# Patient Record
Sex: Female | Born: 1960 | Race: White | Hispanic: No | Smoking: Never smoker
Health system: Southern US, Community
[De-identification: ages and names within clinical notes are randomized; demographics above are authoritative.]

## PROBLEM LIST (undated history)

## (undated) DIAGNOSIS — K219 Gastro-esophageal reflux disease without esophagitis: Secondary | ICD-10-CM

## (undated) DIAGNOSIS — E785 Hyperlipidemia, unspecified: Secondary | ICD-10-CM

## (undated) DIAGNOSIS — N200 Calculus of kidney: Secondary | ICD-10-CM

## (undated) DIAGNOSIS — I10 Essential (primary) hypertension: Secondary | ICD-10-CM

## (undated) HISTORY — PX: KIDNEY STONE SURGERY: SHX686

---

## 2013-01-09 ENCOUNTER — Ambulatory Visit: Payer: Self-pay | Admitting: Internal Medicine

## 2013-01-09 LAB — URINALYSIS, COMPLETE
Bilirubin,UR: NEGATIVE
Glucose,UR: NEGATIVE mg/dL (ref 0–75)
Nitrite: NEGATIVE
Protein: NEGATIVE
Specific Gravity: 1.005 (ref 1.003–1.030)

## 2014-03-28 ENCOUNTER — Ambulatory Visit: Payer: Self-pay | Admitting: Physician Assistant

## 2014-03-28 LAB — URINALYSIS, COMPLETE
Bacteria: NEGATIVE
Bilirubin,UR: NEGATIVE
Blood: NEGATIVE
GLUCOSE, UR: NEGATIVE mg/dL (ref 0–75)
KETONE: NEGATIVE
Leukocyte Esterase: NEGATIVE
Nitrite: NEGATIVE
PH: 7.5 (ref 4.5–8.0)
PROTEIN: NEGATIVE
SPECIFIC GRAVITY: 1.005 (ref 1.003–1.030)
Squamous Epithelial: NONE SEEN
WBC UR: NONE SEEN /HPF (ref 0–5)

## 2015-01-26 IMAGING — CT CT STONE STUDY
2 of 4 series · 17 of 46 positions shown, 19 images · non-contrast
Comparison: 01/09/2013

CLINICAL DATA: Flank pain

EXAM:
CT ABDOMEN AND PELVIS WITHOUT CONTRAST
TECHNIQUE: Multidetector CT imaging of the abdomen and pelvis was performed
following the standard protocol without IV contrast.

[Series 2: stone · axial · 0.74mm/px · z∈[-870,-456]mm · 14 of 91 slices shown, 16 images]
[im 4/91  soft-tissue]
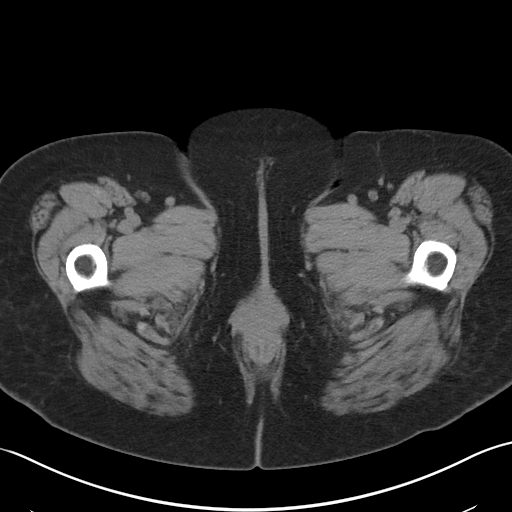
[im 4/91  bone]
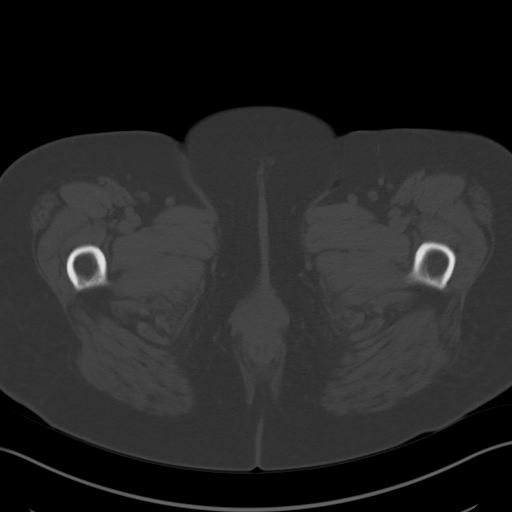
[im 11/91  soft-tissue]
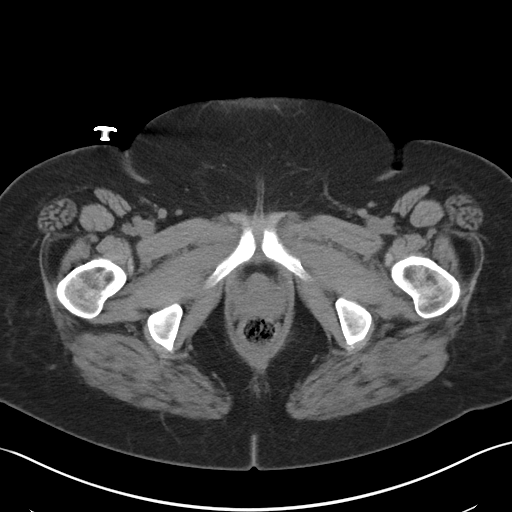
[im 19/91  soft-tissue]
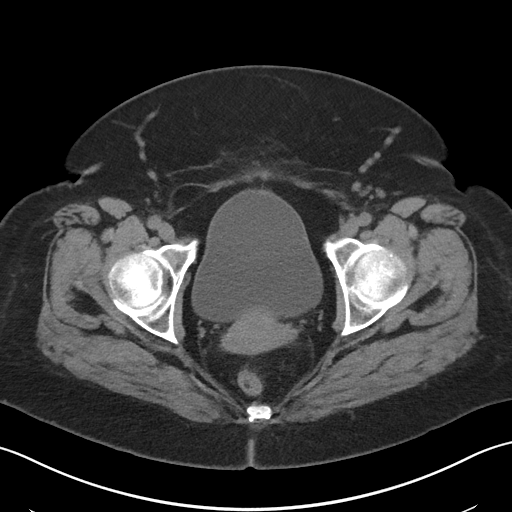
[im 26/91  soft-tissue]
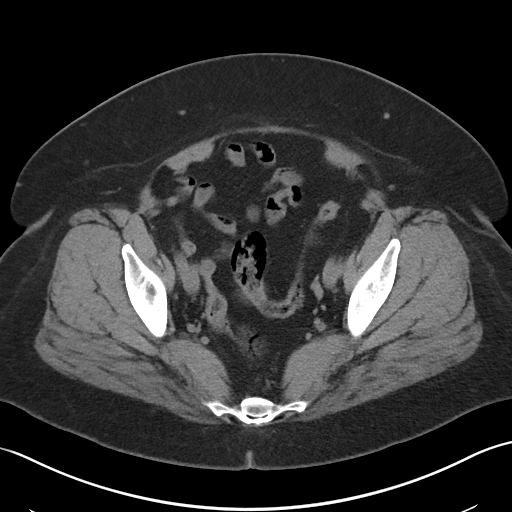
[im 29/91  soft-tissue]
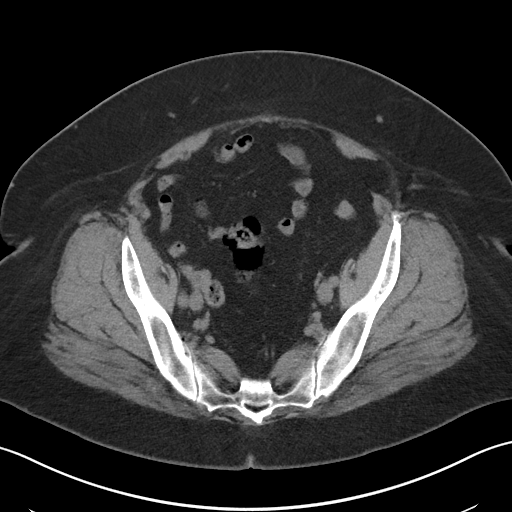
[im 37/91  soft-tissue]
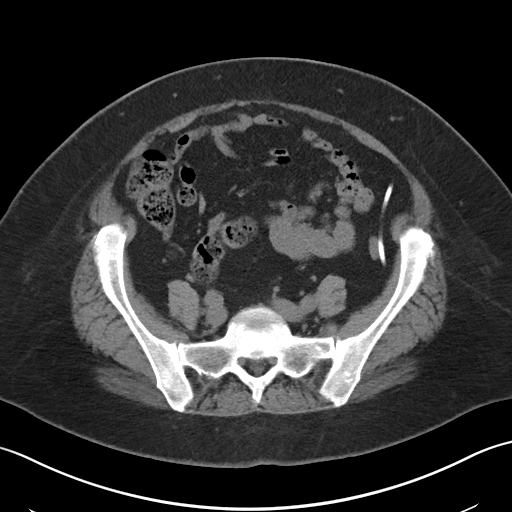
[im 44/91  soft-tissue]
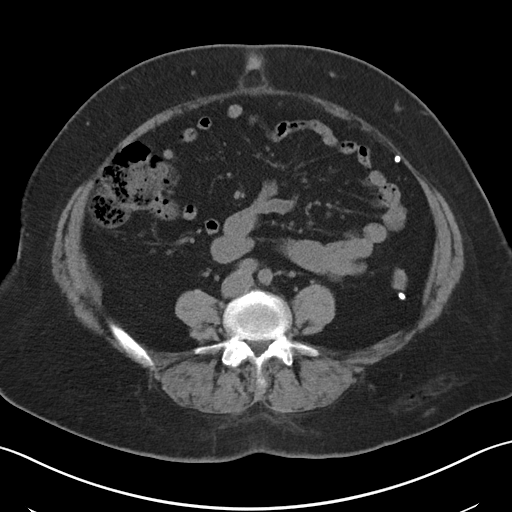
[im 47/91  soft-tissue]
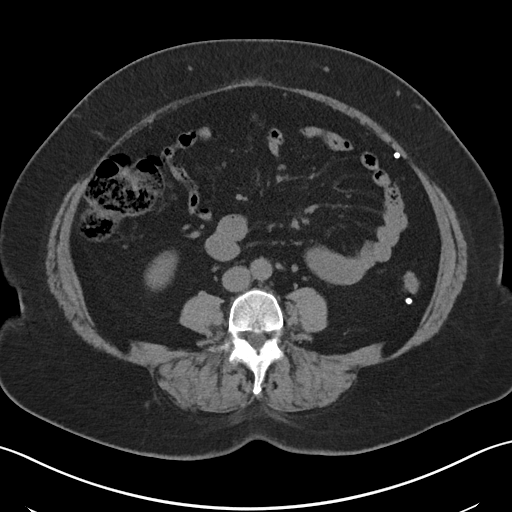
[im 55/91  soft-tissue]
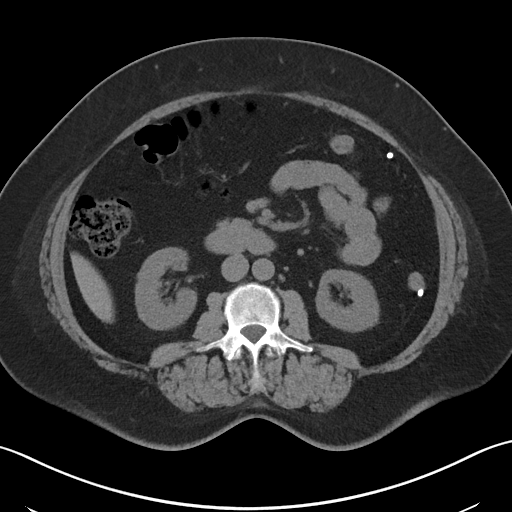
[im 55/91  bone]
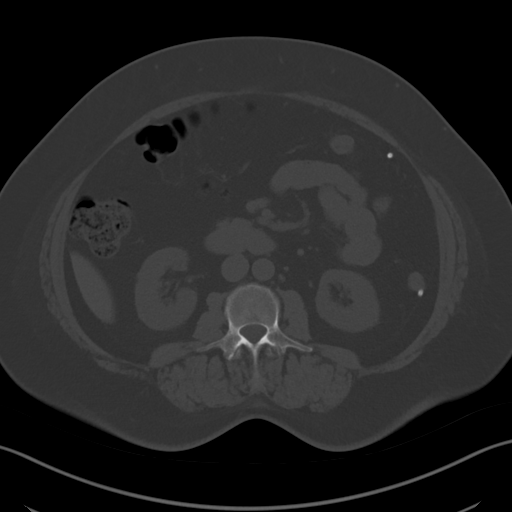
[im 62/91  soft-tissue]
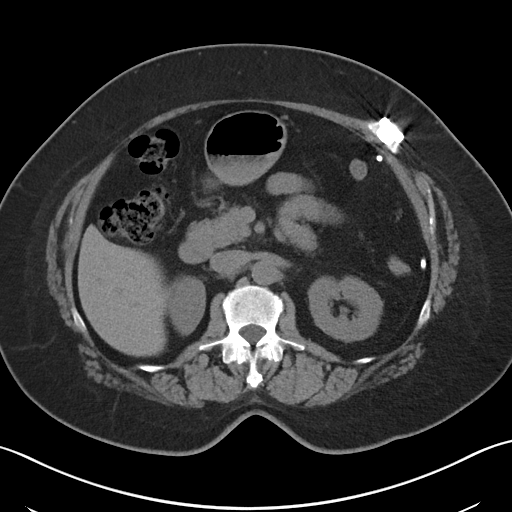
[im 69/91  soft-tissue]
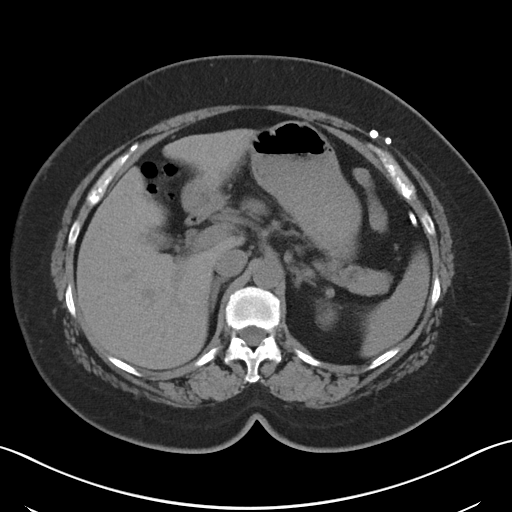
[im 73/91  soft-tissue]
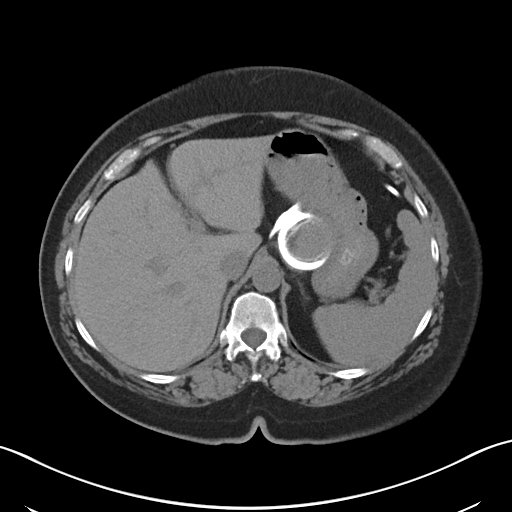
[im 80/91  soft-tissue]
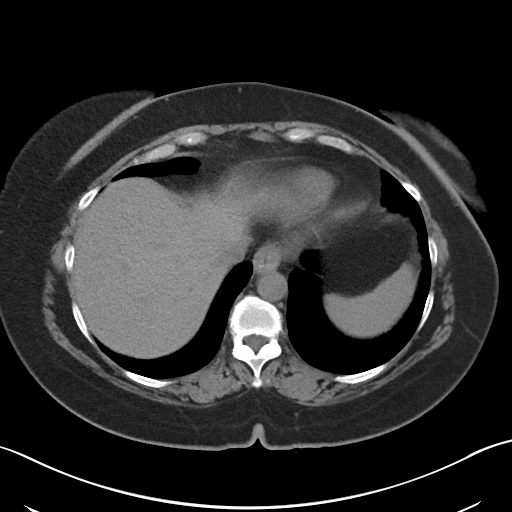
[im 87/91  soft-tissue]
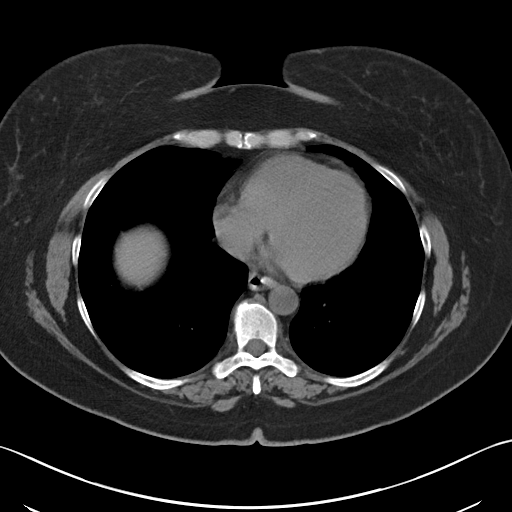

[Series 5: cor · coronal · 0.79mm/px · 3 of 172 slices shown]
[im 58/172  soft-tissue]
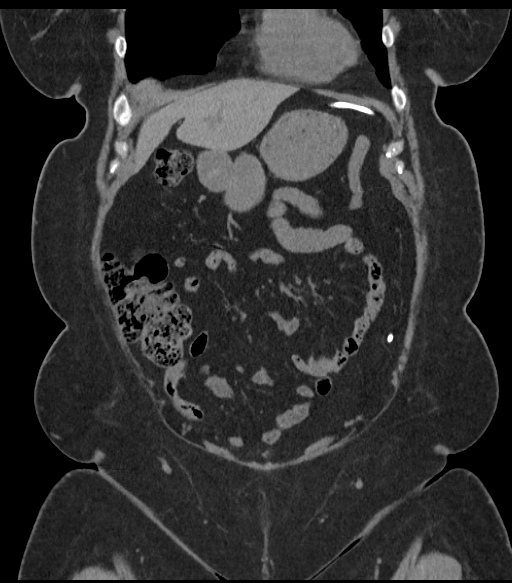
[im 77/172  soft-tissue]
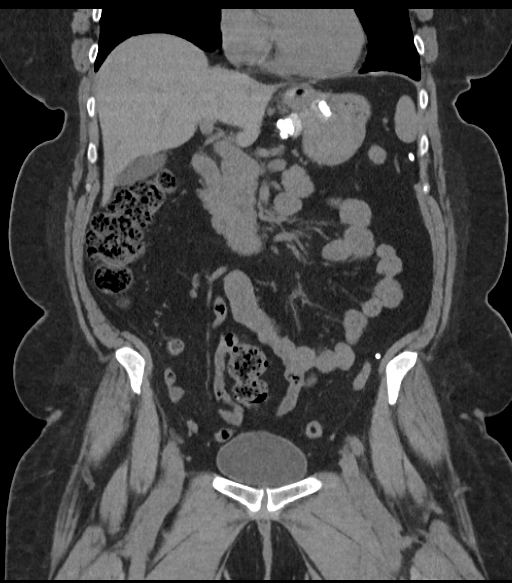
[im 96/172  soft-tissue]
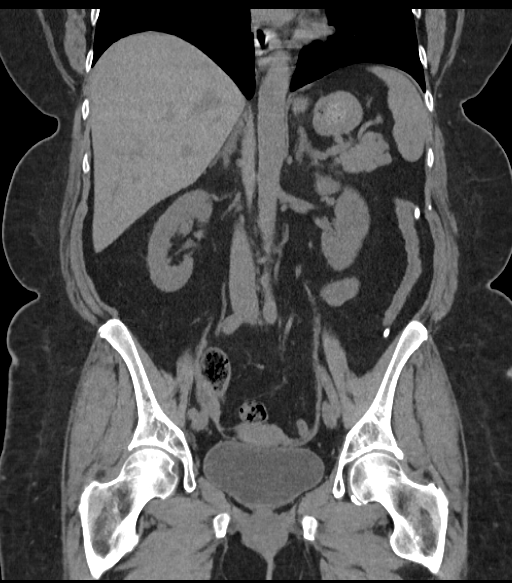

[17 of 46 positions shown; findings below may reference images not displayed]

FINDINGS: Lung bases are free of acute infiltrate or sizable effusion. Liver,
gallbladder, spleen, adrenal glands and pancreas are all normal in
their CT appearance. The kidneys are well visualized bilaterally and
reveal no evidence of renal calculi. No obstructive changes are
seen. The bladder is well distended with non-opacified urine.

A gastric lap band is noted. The angle of the lap band with respect
to the vertical orientation of the spine is approximately 63
degrees. This is greater than expected and may be related to some
gastric band slippage. The band is distended with this fluid. The
catheter appears intact.

The appendix is well visualized and within normal limits. No pelvic
mass lesion or sidewall abnormality is seen. No significant
lymphadenopathy is noted. The osseous structures show no acute
abnormality.
IMPRESSION: Presence of the gastric lap band. The angle of the lap band appears
somewhat greater than that expected at 63 degrees.

No acute abnormality is noted.

## 2015-03-27 ENCOUNTER — Encounter: Payer: Self-pay | Admitting: Gynecology

## 2015-03-27 ENCOUNTER — Ambulatory Visit
Admission: EM | Admit: 2015-03-27 | Discharge: 2015-03-27 | Disposition: A | Discharge status: Home / Self Care | Payer: BLUE CROSS/BLUE SHIELD | Attending: Family Medicine | Admitting: Family Medicine

## 2015-03-27 DIAGNOSIS — T2642XA Burn of left eye and adnexa, part unspecified, initial encounter: Secondary | ICD-10-CM | POA: Diagnosis not present

## 2015-03-27 DIAGNOSIS — T2692XA Corrosion of left eye and adnexa, part unspecified, initial encounter: Secondary | ICD-10-CM

## 2015-03-27 HISTORY — DX: Gastro-esophageal reflux disease without esophagitis: K21.9

## 2015-03-27 HISTORY — DX: Essential (primary) hypertension: I10

## 2015-03-27 HISTORY — DX: Calculus of kidney: N20.0

## 2015-03-27 HISTORY — DX: Hyperlipidemia, unspecified: E78.5

## 2015-03-27 NOTE — ED Notes (Signed)
Patient stated she had dirty  bleach water while cleaning up and it splashed in her let eye and down her left side today.

## 2015-03-27 NOTE — ED Notes (Signed)
Left eye visual acuity 20/20 uncorrected

## 2015-03-27 NOTE — Discharge Instructions (Signed)
Start tobramycin opthalmologic drops 1 drop four times per day Follow up with ophthalmologist on Monday or sooner over the weekend to ER if symptoms worse

## 2015-03-27 NOTE — ED Provider Notes (Signed)
CSN: [id number]     Arrival date & time 03/27/15  1956 History   First MD Initiated Contact with Patient 03/27/15 2004     Chief Complaint  Patient presents with  . Eye Injury   (Consider location/radiation/quality/duration/timing/severity/associated sxs/prior Treatment) Patient is a 54 y.o. female presenting with eye injury. The history is provided by the patient.  Eye Injury This is a new problem. The current episode started less than 1 hour ago (Patient states dirty bleach water splashed into her left eye while cleaning. States she irrigated her eye copiously with tap water. Denies any pain. ). The problem occurs constantly. The problem has not changed since onset.   Past Medical History  Diagnosis Date  . Hypertension   . Hyperlipidemia   . Kidney stones   . GERD (gastroesophageal reflux disease)    History reviewed. No pertinent past surgical history. No family history on file. History  Substance Use Topics  . Smoking status: Never Smoker   . Smokeless tobacco: Not on file  . Alcohol Use: No   OB History    No data available     Review of Systems  Allergies  Ciprofloxacin; Norvasc; Percocet; and Vicodin  Home Medications   Prior to Admission medications   Not on File   BP 154/63 mmHg  Pulse 101  Temp(Src) 97.4 F (36.3 C) (Oral)  Ht 5\' 4"  (1.626 m)  Wt 192 lb (87.091 kg)  BMI 32.94 kg/m2  SpO2 96% Physical Exam  Constitutional: She appears well-developed and well-nourished. No distress.  Eyes: EOM and lids are normal. Pupils are equal, round, and reactive to light. Lids are everted and swept, no foreign bodies found. Right eye exhibits no chemosis, no discharge, no exudate and no hordeolum. No foreign body present in the right eye. Left eye exhibits no chemosis, no discharge, no exudate and no hordeolum. No foreign body present in the left eye. Right conjunctiva is not injected. Right conjunctiva has no hemorrhage. Left conjunctiva is injected. Left  conjunctiva has no hemorrhage. No scleral icterus.  Skin: She is not diaphoretic.  Nursing note and vitals reviewed.    Visual Acuity  Right Eye Distance:   Left Eye Distance:   Bilateral Distance:    Right Eye Near:   Left Eye Near:    Bilateral Near:      Visual Acuity  Right Eye Distance:   Left Eye Distance:   Bilateral Distance:    Right Eye Near:   Left Eye Near:    Bilateral Near:    20:27:54 ED Notes AH  Left eye visual acuity 20/20 uncorrected      ED Course  Procedures (including critical care time) Labs Review Labs Reviewed - No data to display  Imaging Review No results found.   MDM   1. Chemical injury of eye, left, initial encounter    Plan: 1. Diagnosis reviewed with patient; normal visual acuity; no abrasion noted on exam 2. Eye irrigation with Nona Dell done; pH7; patient tolerated procedure well  3. Patient had tobramycin ophthalmic drops from 4 months ago; start drops 1 drop 4x per day; 4. Recommend follow up with her ophthalmologist/optometrist on Monday, however if symptoms worsen over weekend then go to ED  Payton Mccallum, MD 03/30/15 1151

## 2022-04-21 ENCOUNTER — Ambulatory Visit
Admission: EM | Admit: 2022-04-21 | Discharge: 2022-04-21 | Disposition: A | Discharge status: Home / Self Care | Payer: BC Managed Care – PPO | Attending: Emergency Medicine | Admitting: Emergency Medicine

## 2022-04-21 ENCOUNTER — Other Ambulatory Visit: Payer: Self-pay

## 2022-04-21 ENCOUNTER — Ambulatory Visit (INDEPENDENT_AMBULATORY_CARE_PROVIDER_SITE_OTHER): Payer: BC Managed Care – PPO

## 2022-04-21 ENCOUNTER — Ambulatory Visit: Payer: BC Managed Care – PPO

## 2022-04-21 ENCOUNTER — Encounter: Payer: Self-pay | Admitting: Emergency Medicine

## 2022-04-21 DIAGNOSIS — R109 Unspecified abdominal pain: Secondary | ICD-10-CM | POA: Diagnosis not present

## 2022-04-21 DIAGNOSIS — N200 Calculus of kidney: Secondary | ICD-10-CM | POA: Insufficient documentation

## 2022-04-21 DIAGNOSIS — R82998 Other abnormal findings in urine: Secondary | ICD-10-CM | POA: Insufficient documentation

## 2022-04-21 LAB — URINALYSIS, ROUTINE W REFLEX MICROSCOPIC
Glucose, UA: NEGATIVE mg/dL
Nitrite: NEGATIVE
Specific Gravity, Urine: 1.02 (ref 1.005–1.030)
pH: 5.5 (ref 5.0–8.0)

## 2022-04-21 LAB — URINALYSIS, MICROSCOPIC (REFLEX): RBC / HPF: >50 RBC/hpf (ref 0–5)

## 2022-04-21 MED ORDER — CEFIXIME 400 MG PO CAPS: 400.0000 mg | ORAL_CAPSULE | Freq: Every day | ORAL | 0 refills | Status: DC

## 2022-04-21 NOTE — Discharge Instructions (Addendum)
Cefixime 400 mg 1 Tablet Twice a day for 5 days Ibuprofen 200 mg 1 tablet three time a day for 5 days.  Follow up with Urology  CT scan of abdomen and pelvis ordered for tomorrow (per patient request with no contrast)

## 2022-04-21 NOTE — ED Provider Notes (Addendum)
[facility]    CSN: [id number] Arrival date & time: 04/21/22  1706      History   Chief Complaint Chief Complaint  Patient presents with   Flank Pain    right    HPI Alexis Benjamin is a 61 y.o. female.   HPI  She reports that the pain started this afternoon. She rates 6/10 stabbing pain to her right flank area.  She is followed by urology [facility]) last seen 3 weeks ago. She was diagnosed with several stones. She was encourage to have a one year follow up or come back if symptomatic. Denies fever, chills, headache, dizziness, visual changes, shortness of breath, dyspnea on exertion, chest pain, nausea, vomiting or any edema.  She denies high blood pressure despite current reading feels like it is pain related. Reports last BP 130/70's.   Past Medical History:  Diagnosis Date   GERD (gastroesophageal reflux disease)    Hyperlipidemia    Hypertension    Kidney stones     There are no problems to display for this patient.   Past Surgical History:  Procedure Laterality Date   KIDNEY STONE SURGERY      OB History   No obstetric history on file.      Home Medications    Prior to Admission medications   Medication Sig Start Date End Date Taking? Authorizing Provider  ascorbic acid (VITAMIN C) 500 MG tablet Take by mouth.   Yes [provider]  ferrous sulfate 325 (65 FE) MG EC tablet Take by mouth.   Yes [provider]  fluticasone (FLONASE) 50 MCG/ACT nasal spray Place into the nose. 11/13/19  Yes [provider]  gabapentin (NEURONTIN) 300 MG capsule  11/04/20  Yes [provider]  levothyroxine (SYNTHROID) 50 MCG tablet Take by mouth. 11/04/20  Yes [provider]  loratadine-pseudoephedrine (CLARITIN-D 24-HOUR) 10-240 MG 24 hr tablet  11/18/19  Yes [provider]  Magnesium Glycinate 100 MG CAPS Take by mouth.   Yes [provider]  Multiple Vitamin (MULTIVITAMIN) capsule Take 1  capsule by mouth daily. 11/16/12  Yes [provider]  Omega-3 1000 MG CAPS Take by mouth.   Yes [provider]  pravastatin (PRAVACHOL) 20 MG tablet  11/04/20  Yes [provider]  vitamin B-12 (CYANOCOBALAMIN) 1000 MCG tablet Take by mouth.   Yes [provider]  Vitamin D, Ergocalciferol, (DRISDOL) 1.25 MG (50000 UNIT) CAPS capsule Take 50,000 Units by mouth 2 (two) times a week. 04/11/22  Yes [provider]  cefixime (SUPRAX) 400 MG CAPS capsule Take 1 capsule (400 mg total) by mouth daily. 04/22/22   Barbette Merino, NP    Family History No family history on file.  Social History Social History   Tobacco Use   Smoking status: Never  Substance Use Topics   Alcohol use: No   Drug use: No     Allergies   Ciprofloxacin, Norvasc [amlodipine], Percocet [oxycodone-acetaminophen], and Vicodin [hydrocodone-acetaminophen]   Review of Systems Review of Systems   Physical Exam Triage Vital Signs ED Triage Vitals  Enc Vitals Group     BP 04/21/22 1800 (!) 145/104     Pulse Rate 04/21/22 1800 86     Resp 04/21/22 1800 18     Temp 04/21/22 1800 98.7 F (37.1 C)     Temp Source 04/21/22 1800 Oral     SpO2 04/21/22 1800 99 %     Weight 04/21/22 1757 192  lb 0.3 oz (87.1 kg)     Height 04/21/22 1757 5\' 4"  (1.626 m)     Head Circumference --      Peak Flow --      Pain Score 04/21/22 1755 6     Pain Loc --      Pain Edu? --      Excl. in GC? --    No data found.  Updated Vital Signs BP (!) 145/104 (BP Location: Left Arm)   Pulse 86   Temp 98.7 F (37.1 C) (Oral)   Resp 18   Ht 5\' 4"  (1.626 m)   Wt 192 lb 0.3 oz (87.1 kg)   SpO2 99%   BMI 32.96 kg/m   Visual Acuity Right Eye Distance:   Left Eye Distance:   Bilateral Distance:    Right Eye Near:   Left Eye Near:    Bilateral Near:     Physical Exam Constitutional:      General: She is not in acute distress.    Appearance: She is obese. She is not ill-appearing,  toxic-appearing or diaphoretic.  HENT:     Head: Normocephalic and atraumatic.  Cardiovascular:     Rate and Rhythm: Normal rate.  Pulmonary:     Effort: Pulmonary effort is normal.  Abdominal:     Tenderness: There is right CVA tenderness.  Musculoskeletal:        General: Normal range of motion.  Skin:    General: Skin is warm.     Capillary Refill: Capillary refill takes less than 2 seconds.  Neurological:     General: No focal deficit present.     Mental Status: She is alert.      UC Treatments / Results  Labs (all labs ordered are listed, but only abnormal results are displayed) Labs Reviewed  URINALYSIS, ROUTINE W REFLEX MICROSCOPIC - Abnormal; Notable for the following components:      Result Value   APPearance HAZY (*)    Hgb urine dipstick TRACE (*)    Bilirubin Urine SMALL (*)    Ketones, ur TRACE (*)    Protein, ur TRACE (*)    Leukocytes,Ua SMALL (*)    All other components within normal limits  URINALYSIS, MICROSCOPIC (REFLEX) - Abnormal; Notable for the following components:   Bacteria, UA FEW (*)    All other components within normal limits    EKG   Radiology DG Abdomen 1 View  Result Date: 04/21/2022 CLINICAL DATA:  Flank pain. EXAM: ABDOMEN - 1 VIEW COMPARISON:  CT 03/28/2014 FINDINGS: Stones project over both kidneys, larger on the left measuring 6 mm. No convincing stones over the course of the ureters, detailed assessment is obscured by overlying stool and bowel gas. There are enteric chain sutures in the right lower abdomen and left upper quadrant. Nonobstructive bowel gas pattern. Moderate air and gaseous distention of colon without obstruction. No free intra-abdominal air. No acute osseous abnormalities. IMPRESSION: 1. Bilateral nephrolithiasis. No convincing stones over the course of the ureters, detailed assessment is obscured by overlying stool and bowel gas. 2. Nonobstructive bowel gas pattern. Electronically Signed   By: 06/22/2022 M.D.    On: 04/21/2022 18:44    Procedures Procedures (including critical care time)  Medications Ordered in UC Medications - No data to display  Initial Impression / Assessment and Plan / UC Course  I have reviewed the triage vital signs and the nursing notes.  Pertinent labs & imaging results that were available during my  care of the patient were reviewed by me and considered in my medical decision making (see chart for details).     Flank pain  Renal stone  Final Clinical Impressions(s) / UC Diagnoses   Final diagnoses:  Right flank pain  Nephrolithiasis  Leukocytes in urine     Discharge Instructions       Cefixime 400 mg 1 Tablet Twice a day for 5 days Ibuprofen 200 mg 1 tablet three time a day for 5 days.  Follow up with Urology  CT scan of abdomen and pelvis ordered for tomorrow (per patient request with no contrast)     ED Prescriptions     Medication Sig Dispense Auth. Provider   cefixime (SUPRAX) 400 MG CAPS capsule Take 1 capsule (400 mg total) by mouth daily. 10 capsule Barbette Merino, NP      PDMP not reviewed this encounter.   Barbette Merino, NP 04/21/22 1817    Barbette Merino, NP 04/22/22 1057

## 2022-04-21 NOTE — ED Provider Notes (Incomplete Revision)
[facility]    CSN: [id number] Arrival date & time: 04/21/22  1706      History   Chief Complaint Chief Complaint  Patient presents with   Flank Pain    right    HPI Alexis Benjamin is a 61 y.o. female.   HPI  She reports that the pain started this afternoon. She rates 6/10 [ain stabbing pain.  She was seen by urology [facility]) 3 weeks ago. She was diagnosed with several stones. She was encourage to have a one year follow up or come back if symptomatic.   She denies high blood pressure  Past Medical History:  Diagnosis Date   GERD (gastroesophageal reflux disease)    Hyperlipidemia    Hypertension    Kidney stones     There are no problems to display for this patient.   Past Surgical History:  Procedure Laterality Date   KIDNEY STONE SURGERY      OB History   No obstetric history on file.      Home Medications    Prior to Admission medications   Medication Sig Start Date End Date Taking? Authorizing Provider  ascorbic acid (VITAMIN C) 500 MG tablet Take by mouth.   Yes [provider]  cefixime (SUPRAX) 400 MG CAPS capsule Take 1 capsule (400 mg total) by mouth daily. 04/21/22  Yes Barbette Merino, NP  ferrous sulfate 325 (65 FE) MG EC tablet Take by mouth.   Yes [provider]  fluticasone (FLONASE) 50 MCG/ACT nasal spray Place into the nose. 11/13/19  Yes [provider]  gabapentin (NEURONTIN) 300 MG capsule  11/04/20  Yes [provider]  levothyroxine (SYNTHROID) 50 MCG tablet Take by mouth. 11/04/20  Yes [provider]  loratadine-pseudoephedrine (CLARITIN-D 24-HOUR) 10-240 MG 24 hr tablet  11/18/19  Yes [provider]  Magnesium Glycinate 100 MG CAPS Take by mouth.   Yes [provider]  Multiple Vitamin (MULTIVITAMIN) capsule Take 1 capsule by mouth daily. 11/16/12  Yes [provider]  Omega-3 1000 MG CAPS Take by mouth.   Yes [provider]   pravastatin (PRAVACHOL) 20 MG tablet  11/04/20  Yes [provider]  vitamin B-12 (CYANOCOBALAMIN) 1000 MCG tablet Take by mouth.   Yes [provider]  Vitamin D, Ergocalciferol, (DRISDOL) 1.25 MG (50000 UNIT) CAPS capsule Take 50,000 Units by mouth 2 (two) times a week. 04/11/22  Yes [provider]    Family History No family history on file.  Social History Social History   Tobacco Use   Smoking status: Never  Substance Use Topics   Alcohol use: No   Drug use: No     Allergies   Ciprofloxacin, Norvasc [amlodipine], Percocet [oxycodone-acetaminophen], and Vicodin [hydrocodone-acetaminophen]   Review of Systems Review of Systems   Physical Exam Triage Vital Signs ED Triage Vitals  Enc Vitals Group     BP 04/21/22 1800 (!) 145/104     Pulse Rate 04/21/22 1800 86     Resp 04/21/22 1800 18     Temp 04/21/22 1800 98.7 F (37.1 C)     Temp Source 04/21/22 1800 Oral     SpO2 04/21/22 1800 99 %     Weight 04/21/22 1757 192 lb 0.3 oz (87.1 kg)     Height 04/21/22 1757 5\' 4"  (1.626 m)     Head Circumference --      Peak Flow --      Pain Score 04/21/22  1755 6     Pain Loc --      Pain Edu? --      Excl. in GC? --    No data found.  Updated Vital Signs BP (!) 145/104 (BP Location: Left Arm)   Pulse 86   Temp 98.7 F (37.1 C) (Oral)   Resp 18   Ht 5\' 4"  (1.626 m)   Wt 192 lb 0.3 oz (87.1 kg)   SpO2 99%   BMI 32.96 kg/m   Visual Acuity Right Eye Distance:   Left Eye Distance:   Bilateral Distance:    Right Eye Near:   Left Eye Near:    Bilateral Near:     Physical Exam   UC Treatments / Results  Labs (all labs ordered are listed, but only abnormal results are displayed) Labs Reviewed  URINALYSIS, ROUTINE W REFLEX MICROSCOPIC - Abnormal; Notable for the following components:      Result Value   APPearance HAZY (*)    Hgb urine dipstick TRACE (*)    Bilirubin Urine SMALL (*)    Ketones, ur TRACE (*)    Protein, ur TRACE  (*)    Leukocytes,Ua SMALL (*)    All other components within normal limits  URINALYSIS, MICROSCOPIC (REFLEX) - Abnormal; Notable for the following components:   Bacteria, UA FEW (*)    All other components within normal limits    EKG   Radiology DG Abdomen 1 View  Result Date: 04/21/2022 CLINICAL DATA:  Flank pain. EXAM: ABDOMEN - 1 VIEW COMPARISON:  CT 03/28/2014 FINDINGS: Stones project over both kidneys, larger on the left measuring 6 mm. No convincing stones over the course of the ureters, detailed assessment is obscured by overlying stool and bowel gas. There are enteric chain sutures in the right lower abdomen and left upper quadrant. Nonobstructive bowel gas pattern. Moderate air and gaseous distention of colon without obstruction. No free intra-abdominal air. No acute osseous abnormalities. IMPRESSION: 1. Bilateral nephrolithiasis. No convincing stones over the course of the ureters, detailed assessment is obscured by overlying stool and bowel gas. 2. Nonobstructive bowel gas pattern. Electronically Signed   By: 05/28/2014 M.D.   On: 04/21/2022 18:44    Procedures Procedures (including critical care time)  Medications Ordered in UC Medications - No data to display  Initial Impression / Assessment and Plan / UC Course  I have reviewed the triage vital signs and the nursing notes.  Pertinent labs & imaging results that were available during my care of the patient were reviewed by me and considered in my medical decision making (see chart for details).     Flank pain  Renal stone  Final Clinical Impressions(s) / UC Diagnoses   Final diagnoses:  Right flank pain     Discharge Instructions       Cefixime 400 mg 1 Tablet Twice a day for 5 days Ibuprofen 200 mg 1 tablet three time a day for 5 days.  Follow up with Urology      ED Prescriptions     Medication Sig Dispense Auth. Provider   cefixime (SUPRAX) 400 MG CAPS capsule Take 1 capsule (400 mg total) by  mouth daily. 10 capsule 06/22/2022, NP      PDMP not reviewed this encounter.   Barbette Merino [place], [place] 04/21/22 [id number]

## 2022-04-21 NOTE — ED Triage Notes (Signed)
Pt c/o right flank pain. Started today. She states she saw her urologist about 3 weeks ago and was told she kidney stones on both sides but they were not bothering her at the time.

## 2022-04-22 ENCOUNTER — Telehealth: Payer: Self-pay | Admitting: Emergency Medicine

## 2022-04-22 ENCOUNTER — Telehealth: Payer: Self-pay

## 2022-04-22 ENCOUNTER — Ambulatory Visit: Admission: RE | Admit: 2022-04-22 | Payer: BC Managed Care – PPO | Source: Ambulatory Visit

## 2022-04-22 MED ORDER — CEFIXIME 400 MG PO CAPS: 400.0000 mg | ORAL_CAPSULE | Freq: Every day | ORAL | 0 refills | Status: AC

## 2022-04-22 MED ORDER — CEFIXIME 400 MG PO CAPS: 400.0000 mg | ORAL_CAPSULE | Freq: Every day | ORAL | 0 refills | Status: DC

## 2022-04-22 NOTE — Telephone Encounter (Signed)
Patient would like prescription sent to [facility] in [place] instead of [address].  Prescription sent to [facility]

## 2022-04-22 NOTE — Telephone Encounter (Signed)
Received message from [facility] RN stating pt had arrived to [facility] for outpatient CT s/p recent UC encounter.  Reviewed chart, spoke to Dr. Leonides Grills.  CT will not be done outpatient after UC visit.  Pt should f/u with Urology for ongoing management of kidney stones including need for additional imaging.  Spoke to pt, advised of this information. Pt verbalizes understanding.  Also discussed abx.  Pt states her pharmacy is out of stock on med and she is unsure of another pharmacy she would want to use. Advised to determine which pharmacy she would prefer and confirm if they have med then we can resend.  Again, pt verbalizes understanding.

## 2024-05-31 ENCOUNTER — Other Ambulatory Visit: Payer: Self-pay | Admitting: Medical Genetics

## 2024-06-12 ENCOUNTER — Other Ambulatory Visit: Payer: Self-pay

## 2024-06-12 DIAGNOSIS — Z006 Encounter for examination for normal comparison and control in clinical research program: Secondary | ICD-10-CM

## 2024-06-21 LAB — GENECONNECT MOLECULAR SCREEN: Genetic Analysis Overall Interpretation: NEGATIVE
